# Patient Record
Sex: Male | Born: 1980
Health system: Southern US, Community
[De-identification: ages and names within clinical notes are randomized; demographics above are authoritative.]

## PROBLEM LIST (undated history)

## (undated) DIAGNOSIS — I10 Essential (primary) hypertension: Secondary | ICD-10-CM

## (undated) DIAGNOSIS — F329 Major depressive disorder, single episode, unspecified: Secondary | ICD-10-CM

## (undated) DIAGNOSIS — F32A Depression, unspecified: Secondary | ICD-10-CM

## (undated) DIAGNOSIS — F419 Anxiety disorder, unspecified: Secondary | ICD-10-CM

## (undated) DIAGNOSIS — E785 Hyperlipidemia, unspecified: Secondary | ICD-10-CM

## (undated) HISTORY — DX: Hyperlipidemia, unspecified: E78.5

## (undated) HISTORY — DX: Anxiety disorder, unspecified: F41.9

## (undated) HISTORY — DX: Major depressive disorder, single episode, unspecified: F32.9

## (undated) HISTORY — DX: Depression, unspecified: F32.A

---

## 2010-08-05 ENCOUNTER — Emergency Department: Payer: Self-pay | Admitting: Emergency Medicine

## 2013-02-26 ENCOUNTER — Encounter (HOSPITAL_COMMUNITY): Payer: Self-pay | Admitting: Emergency Medicine

## 2013-02-26 ENCOUNTER — Emergency Department (HOSPITAL_COMMUNITY)
Admission: EM | Admit: 2013-02-26 | Discharge: 2013-02-26 | Disposition: A | Discharge status: Home / Self Care | Payer: Self-pay | Attending: Emergency Medicine | Admitting: Emergency Medicine

## 2013-02-26 DIAGNOSIS — R51 Headache: Secondary | ICD-10-CM | POA: Insufficient documentation

## 2013-02-26 DIAGNOSIS — I10 Essential (primary) hypertension: Secondary | ICD-10-CM | POA: Insufficient documentation

## 2013-02-26 DIAGNOSIS — R519 Headache, unspecified: Secondary | ICD-10-CM

## 2013-02-26 HISTORY — DX: Essential (primary) hypertension: I10

## 2013-02-26 MED ORDER — DIPHENHYDRAMINE HCL 50 MG/ML IJ SOLN
50.0000 mg | Freq: Once | INTRAMUSCULAR | Status: AC
  Administered 2013-02-26: 50 mg via INTRAMUSCULAR
  Filled 2013-02-26: qty 1

## 2013-02-26 MED ORDER — METOCLOPRAMIDE HCL 5 MG/ML IJ SOLN
10.0000 mg | Freq: Once | INTRAMUSCULAR | Status: AC
  Administered 2013-02-26: 10 mg via INTRAMUSCULAR
  Filled 2013-02-26: qty 2

## 2013-02-26 NOTE — ED Provider Notes (Signed)
CSN: [id number]     Arrival date & time 02/26/13  1301 History   This chart was scribed for Joya Gaskins, MD by Bennett Scrape, ED Scribe. This patient was seen in room APA11/APA11 and the patient's care was started at 1:41 PM.   Chief Complaint  Patient presents with  . Headache    Patient is a 32 y.o. male presenting with headaches. The history is provided by the patient. No language interpreter was used.  Headache Location: posterior vertex. Radiates to:  Does not radiate Severity currently:  6/10 Severity at highest:  6/10 Onset quality: rapid. Duration:  2 weeks Timing:  Intermittent Chronicity:  New Context comment:  Uncontrolled HTN Relieved by:  Resting in a darkened room and acetaminophen Associated symptoms: no blurred vision, no fever, no nausea, no visual change and no vomiting     HPI Comments: Bruce Mitchell is a 32 y.o. male who presents to the Emergency Department complaining of an intermittent HA located at the posterior vertex that started rapidly two weeks ago. He states that the HA waxes and wanes during the episodes and has occasionally improved with Aleve. He attributes the pain to his h/o HTN. He states that he has not been taking any HTN medications in over the past two years. His HTN was regulated while incarcerated with hydrochlorothiazide. He is unable to provide any recent BP measurements. BP in the ED is 132/83. He denies any prior h/o CVA or TIA. He denies any recent long distance or out of country travels. He denies any fevers, emesis, visual disturbance and weakness in extremities as associated symptoms.  No PCP currently  Past Medical History  Diagnosis Date  . Hypertension    History reviewed. No pertinent past surgical history. History reviewed. No pertinent family history. History  Substance Use Topics  . Smoking status: Never Smoker   . Smokeless tobacco: Not on file  . Alcohol Use: No    Review of Systems  Constitutional:  Negative for fever.  Eyes: Negative for blurred vision and visual disturbance.  Gastrointestinal: Negative for nausea and vomiting.  Neurological: Positive for headaches.  All other systems reviewed and are negative.    Allergies  Review of patient's allergies indicates no known allergies.  Home Medications  No current outpatient prescriptions on file.  Triage Vitals: BP 132/83  Pulse 69  Temp(Src) 98.3 F (36.8 C) (Oral)  Resp 14  Ht 6' (1.829 m)  Wt 185 lb (83.915 kg)  BMI 25.08 kg/m2  SpO2 99%  Physical Exam  Nursing note and vitals reviewed.  CONSTITUTIONAL: Well developed/well nourished HEAD: Normocephalic/atraumatic EYES: EOMI/PERRL, no nystagmus ENMT: Mucous membranes moist NECK: supple no meningeal signs, no bruits CV: S1/S2 noted, no murmurs/rubs/gallops noted LUNGS: Lungs are clear to auscultation bilaterally, no apparent distress ABDOMEN: soft, nontender, no rebound or guarding NEURO:Awake/alert, facies symmetric, no arm or leg drift is noted Cranial nerves 3/4/5/6/11/15/08/11/12 tested and intact Gait normal without ataxia EXTREMITIES: pulses normal, full ROM SKIN: warm, color normal PSYCH: no abnormalities of mood noted  ED Course  Procedures (including critical care time)  DIAGNOSTIC STUDIES: Oxygen Saturation is 99% on room air, normal by my interpretation.    COORDINATION OF CARE: 1:46 PM-Discussed treatment plan which includes following up for HTN and medications for HA with pt at bedside and pt agreed to plan. Pt has a ride home.    EKG Interpretation   None       Pt well appearing, no distress, low suspicion for [facility]  or other acute neurologic process I encouraged close followup and BP monitoring   MDM  No diagnosis found. Nursing notes including past medical history and social history reviewed and considered in documentation Labs/vital reviewed and considered   I personally performed the services described in this documentation,  which was scribed in my presence. The recorded information has been reviewed and is accurate.    Joya Gaskins, MD 02/26/13 2216

## 2013-02-26 NOTE — ED Notes (Signed)
Patient to ED c/o headache that has been ongoing for "3 weeks" per patient. States that headache is worse with noise. Patient states that he is dizzy when he gets up sometimes. Patient reports that he has been treated with high blood pressure medication in the past but has not had it for approx 6 months. Pain 6/10 described as throbbing and aching. Patient denies nausea, vomiting, and diarrhea. Patient reports to be very thirsty and drinking lots of fluids at home. Patient states that his appetite has been normal. Patient has been taking Advil for pain but reports that it has not helped the past few days, last taken yesterday.

## 2013-02-26 NOTE — ED Notes (Signed)
Has had 2 week history of HA intermittently.  Over last two days has been more severe.  Is suppose to be on BP meds, but does not have any.

## 2013-02-26 NOTE — Progress Notes (Signed)
ED/CM noted patient did not have health insurance and/or PCP listed in the computer.  Patient was given the [place] resource handout with information on the clinics, food pantries, and the handout for new health insurance sign-up.  Patient expressed appreciation for this. 

## 2014-11-27 ENCOUNTER — Encounter: Payer: Self-pay | Admitting: Family Medicine

## 2014-11-27 DIAGNOSIS — F329 Major depressive disorder, single episode, unspecified: Secondary | ICD-10-CM | POA: Insufficient documentation

## 2014-11-27 DIAGNOSIS — F32A Depression, unspecified: Secondary | ICD-10-CM | POA: Insufficient documentation

## 2014-11-27 DIAGNOSIS — I10 Essential (primary) hypertension: Secondary | ICD-10-CM | POA: Insufficient documentation

## 2014-11-27 DIAGNOSIS — F419 Anxiety disorder, unspecified: Secondary | ICD-10-CM | POA: Insufficient documentation

## 2014-11-27 DIAGNOSIS — E785 Hyperlipidemia, unspecified: Secondary | ICD-10-CM | POA: Insufficient documentation

## 2014-12-03 ENCOUNTER — Ambulatory Visit: Payer: 59 | Admitting: Family Medicine

## 2014-12-16 ENCOUNTER — Ambulatory Visit: Payer: Self-pay | Admitting: Family Medicine

## 2015-01-01 ENCOUNTER — Telehealth: Payer: Self-pay | Admitting: Family Medicine

## 2015-01-01 ENCOUNTER — Encounter: Payer: Self-pay | Admitting: Family Medicine

## 2015-01-01 ENCOUNTER — Ambulatory Visit (INDEPENDENT_AMBULATORY_CARE_PROVIDER_SITE_OTHER): Payer: 59 | Admitting: Family Medicine

## 2015-01-01 VITALS — BP 148/88 | HR 82 | Temp 98.9°F | Resp 16 | Ht 69.5 in | Wt 219.0 lb

## 2015-01-01 DIAGNOSIS — F419 Anxiety disorder, unspecified: Secondary | ICD-10-CM

## 2015-01-01 DIAGNOSIS — Z0000 Encounter for general adult medical examination without abnormal findings: Secondary | ICD-10-CM | POA: Diagnosis not present

## 2015-01-01 DIAGNOSIS — F32A Depression, unspecified: Secondary | ICD-10-CM

## 2015-01-01 DIAGNOSIS — F329 Major depressive disorder, single episode, unspecified: Secondary | ICD-10-CM | POA: Diagnosis not present

## 2015-01-01 DIAGNOSIS — E669 Obesity, unspecified: Secondary | ICD-10-CM

## 2015-01-01 DIAGNOSIS — E785 Hyperlipidemia, unspecified: Secondary | ICD-10-CM

## 2015-01-01 DIAGNOSIS — F39 Unspecified mood [affective] disorder: Secondary | ICD-10-CM | POA: Diagnosis not present

## 2015-01-01 DIAGNOSIS — I10 Essential (primary) hypertension: Secondary | ICD-10-CM | POA: Diagnosis not present

## 2015-01-01 MED ORDER — HYDROCHLOROTHIAZIDE 25 MG PO TABS: 25.0000 mg | ORAL_TABLET | Freq: Every day | ORAL | Status: DC

## 2015-01-01 NOTE — Patient Instructions (Signed)
Start HCTZ once a day in the morning for blood pressure Referral to psychiatrist  We will call with lab results  F/U 6 weeks- blood pressure check

## 2015-01-01 NOTE — Assessment & Plan Note (Signed)
Start HCTZ  once a day, recheck in 6 weeks

## 2015-01-01 NOTE — Progress Notes (Signed)
Patient ID: Bruce Mitchell, male   DOB: 23-Jun-1980, 34 y.o.   MRN: [id number]   Subjective:    Patient ID: Bruce Mitchell, male    DOB: [birth year], 34 y.o.   MRN: [id number]  Patient presents for New Patient- Establish Care; HTN; Hyperlipidemia; and Anxiety  patient here for new physical and to establish care. He was last seen when he was incarcerated at that time he was diagnosed with anxiety as well as hypertension and hyperlipidemia he states that he was on some medications while incarcerated but does not remember the exact names of the medications. He gets headaches when his blood pressure is running high at times. He denies any chest pain or shortness of breath. He denies any tobacco use he quit this as well as marijuana. He denies any alcohol abuse.  His other major concern is his mood he states at times he felt depressed other times he has significant anger issues and has outbursts he also gets anxiety. There was concern that he was bipolar but he never followed up with mental health when he was I out of prison. He will like to be referred to a psychiatrist for further evaluation and treatment. He also has difficulty sleeping.   when asked about his bowels he states that if he eats any dairy products and he will have diarrhea otherwise he is not having problems with his bowels.   he does tell me that he is gaining weight over the past couple years his weight was typically around 175 pounds   his family history was reviewed he is not sure what type of cancer that his father passed away from.    Review Of Systems:  GEN- denies fatigue, fever, weight loss,weakness, recent illness HEENT- denies eye drainage, change in vision, nasal discharge, CVS- denies chest pain, palpitations RESP- denies SOB, cough, wheeze ABD- denies N/V, change in stools, abd pain GU- denies dysuria, hematuria, dribbling, incontinence MSK- denies joint pain, muscle aches, injury Neuro- denies headache, dizziness,  syncope, seizure activity       Objective:    BP 148/88 mmHg  Pulse 82  Temp(Src) 98.9 F (37.2 C) (Oral)  Resp 16  Ht 5' 9.5" (1.765 m)  Wt 219 lb (99.338 kg)  BMI 31.89 kg/m2 GEN- NAD, alert and oriented x3,obese HEENT- PERRL, EOMI, non injected sclera, pink conjunctiva, MMM, oropharynx clear Neck- Supple, no thyromegaly CVS- RRR, no murmur RESP-CTAB ABD-NABS,soft,NT,ND Psych- very pleasant, normal affect and mood,  EXT- No edema Pulses- Radial, DP- 2+        Assessment & Plan:      Problem List Items Addressed This Visit    Mood disorder   Hypertension - Primary    Start HCTZ  once a day, recheck in 6 weeks      Relevant Medications   hydrochlorothiazide (HYDRODIURIL) 25 MG tablet   Other Relevant Orders   CBC with Differential/Platelet   Comprehensive metabolic panel   TSH   Hyperlipidemia   Relevant Medications   hydrochlorothiazide (HYDRODIURIL) 25 MG tablet   Other Relevant Orders   Lipid panel   Depression     He has episodic mood changes along with depression and anxiety he is always had troubles with his mood since he was a teenager. I discussed possibly starting a low-dose medication and treat his depression but he wants to hold until he is evaluated by psychiatry.  He shows no evidence of harm to himself or others therefore I will place referral for evaluation before  he starts medication      Anxiety    Other Visit Diagnoses    Routine general medical examination at a health care facility        CPE done, TDAP done in prison, fasting labs obtained, unknown type of cancer but he thinks it was due to his fathers smoking       Note: This dictation was prepared with Dragon dictation along with smaller phrase technology. Any transcriptional errors that result from this process are unintentional.

## 2015-01-01 NOTE — Telephone Encounter (Signed)
noted 

## 2015-01-01 NOTE — Assessment & Plan Note (Signed)
He has episodic mood changes along with depression and anxiety he is always had troubles with his mood since he was a teenager. I discussed possibly starting a low-dose medication and treat his depression but he wants to hold until he is evaluated by psychiatry.  He shows no evidence of harm to himself or others therefore I will place referral for evaluation before he starts medication

## 2015-01-01 NOTE — Telephone Encounter (Signed)
Wife (is a Engineer, civil (consulting)) wants you to know he has HTN, not taking meds.  Possibly diabetic.  When he eats has sudden diarrhea.  Question if Bipolar, was suppose to see [facility] provider but did not.  Strong family history of all.  Wanted to let you know.  Pt new visit with you today and she was unable to come with him.

## 2015-01-01 NOTE — Telephone Encounter (Signed)
This encounter was created in error - please disregard.

## 2015-01-02 LAB — COMPREHENSIVE METABOLIC PANEL
ALT: 18 U/L (ref 9–46)
AST: 15 U/L (ref 10–40)
Albumin: 4.8 g/dL (ref 3.6–5.1)
Alkaline Phosphatase: 43 U/L (ref 40–115)
BILIRUBIN TOTAL: 0.9 mg/dL (ref 0.2–1.2)
BUN: 8 mg/dL (ref 7–25)
CO2: 23 mmol/L (ref 20–31)
CREATININE: 1.06 mg/dL (ref 0.60–1.35)
Calcium: 9.5 mg/dL (ref 8.6–10.3)
Chloride: 104 mmol/L (ref 98–110)
GLUCOSE: 88 mg/dL (ref 70–99)
Potassium: 4.1 mmol/L (ref 3.5–5.3)
SODIUM: 140 mmol/L (ref 135–146)
Total Protein: 7.7 g/dL (ref 6.1–8.1)

## 2015-01-02 LAB — CBC WITH DIFFERENTIAL/PLATELET
Basophils Absolute: 0.1 10*3/uL (ref 0.0–0.1)
Basophils Relative: 1 % (ref 0–1)
EOS PCT: 7 % — AB (ref 0–5)
Eosinophils Absolute: 0.4 10*3/uL (ref 0.0–0.7)
HCT: 39.7 % (ref 39.0–52.0)
Hemoglobin: 12.7 g/dL — ABNORMAL LOW (ref 13.0–17.0)
Lymphocytes Relative: 41 % (ref 12–46)
Lymphs Abs: 2.2 10*3/uL (ref 0.7–4.0)
MCH: 28 pg (ref 26.0–34.0)
MCHC: 32 g/dL (ref 30.0–36.0)
MCV: 87.6 fL (ref 78.0–100.0)
MONO ABS: 0.3 10*3/uL (ref 0.1–1.0)
MPV: 10.6 fL (ref 8.6–12.4)
Monocytes Relative: 5 % (ref 3–12)
NEUTROS ABS: 2.5 10*3/uL (ref 1.7–7.7)
Neutrophils Relative %: 46 % (ref 43–77)
Platelets: 301 10*3/uL (ref 150–400)
RBC: 4.53 MIL/uL (ref 4.22–5.81)
RDW: 13 % (ref 11.5–15.5)
WBC: 5.4 10*3/uL (ref 4.0–10.5)

## 2015-01-02 LAB — LIPID PANEL
Cholesterol: 263 mg/dL — ABNORMAL HIGH (ref 125–200)
HDL: 35 mg/dL — ABNORMAL LOW (ref >=40)
LDL CALC: 186 mg/dL — AB (ref <130)
Total CHOL/HDL Ratio: 7.5 Ratio — ABNORMAL HIGH (ref <=5.0)
Triglycerides: 210 mg/dL — ABNORMAL HIGH (ref <150)
VLDL: 42 mg/dL — ABNORMAL HIGH (ref <30)

## 2015-01-02 LAB — TSH: TSH: 2.228 u[IU]/mL (ref 0.350–4.500)

## 2015-01-07 ENCOUNTER — Other Ambulatory Visit: Payer: Self-pay | Admitting: *Deleted

## 2015-01-07 MED ORDER — ATORVASTATIN CALCIUM 20 MG PO TABS: 20.0000 mg | ORAL_TABLET | Freq: Every day | ORAL | Status: DC

## 2015-02-12 ENCOUNTER — Ambulatory Visit: Payer: 59 | Admitting: Family Medicine

## 2015-04-02 ENCOUNTER — Ambulatory Visit (INDEPENDENT_AMBULATORY_CARE_PROVIDER_SITE_OTHER): Payer: 59 | Admitting: Family Medicine

## 2015-04-02 ENCOUNTER — Encounter: Payer: Self-pay | Admitting: Family Medicine

## 2015-04-02 VITALS — BP 130/78 | HR 82 | Temp 99.1°F | Resp 16 | Ht 69.5 in | Wt 214.0 lb

## 2015-04-02 DIAGNOSIS — R222 Localized swelling, mass and lump, trunk: Secondary | ICD-10-CM | POA: Diagnosis not present

## 2015-04-02 DIAGNOSIS — E785 Hyperlipidemia, unspecified: Secondary | ICD-10-CM

## 2015-04-02 DIAGNOSIS — I10 Essential (primary) hypertension: Secondary | ICD-10-CM | POA: Diagnosis not present

## 2015-04-02 DIAGNOSIS — Z113 Encounter for screening for infections with a predominantly sexual mode of transmission: Secondary | ICD-10-CM

## 2015-04-02 NOTE — Patient Instructions (Addendum)
[facility] [phone]- call for psychiatry appointment Continue current medications SCAN OF CHEST TO BE DONE F/U 6 MONTHS

## 2015-04-02 NOTE — Progress Notes (Signed)
Patient ID: Bruce Mitchell, male   DOB: [birth year], 34 y.o.   MRN: [id number]   Subjective:    Patient ID: Bruce BourbonKenneth Mitchell, male    DOB: [birth year], 34 y.o.   MRN: [id number]  Patient presents for F/U BP  patient to follow-up blood pressure. He's been on hydrochlorothiazide which I started at her last visit. He is also on Lipitor secondary to hyperlipidemia. He is due for recheck on labs. At the end of the visit he states that he has a knot on his right pectoral muscle that is been there and has been painful he thinks it is been growing. He has pain when he moves his arm around. It has been present for greater than a year. He is not Brammer any particular injury. He does have tattoos but they're old.    Review Of Systems:  GEN- denies fatigue, fever, weight loss,weakness, recent illness HEENT- denies eye drainage, change in vision, nasal discharge, CVS- denies chest pain, palpitations RESP- denies SOB, cough, wheeze ABD- denies N/V, change in stools, abd pain GU- denies dysuria, hematuria, dribbling, incontinence MSK- denies joint pain, muscle aches, injury Neuro- denies headache, dizziness, syncope, seizure activity       Objective:    BP 130/78 mmHg  Pulse 82  Temp(Src) 99.1 F (37.3 C) (Oral)  Resp 16  Ht 5' 9.5" (1.765 m)  Wt 214 lb (97.07 kg)  BMI 31.16 kg/m2 GEN- NAD, alert and oriented x3 HEENT- PERRL, EOMI, non injected sclera, pink conjunctiva, MMM, oropharynx clear Chest wall- Right Pec- small quarter size lump palpated TTP no flutance, not visible on skin ?lipoma CVS- RRR, no murmur RESP-CTAB EXT- No edema Pulses- Radial 2+        Assessment & Plan:      Problem List Items Addressed This Visit    Hypertension    Well controlled, no change to HCTZ Given number to psychiatry again, he did not schedule appt after last visit       Relevant Orders   CBC with Differential/Platelet (Completed)   Comprehensive metabolic panel (Completed)   Hyperlipidemia -  Primary    Continue lipitor, check lipids and LFT Discussed diet, weight down some , he has been more active, changing diet       Relevant Orders   Lipid panel (Completed)    Other Visit Diagnoses    Screen for STD (sexually transmitted disease)        Relevant Orders    HIV antibody (Completed)    RPR (Completed)    Chest wall mass         Subtle mass with tenderness over right pec, ?lipoma or musculature change, however per pt growing and painful, r/o soft tissue mass- CT chest     Relevant Orders    CT Chest W Contrast       Note: This dictation was prepared with Dragon dictation along with smaller phrase technology. Any transcriptional errors that result from this process are unintentional.

## 2015-04-03 ENCOUNTER — Encounter: Payer: Self-pay | Admitting: Family Medicine

## 2015-04-03 LAB — CBC WITH DIFFERENTIAL/PLATELET
Basophils Absolute: 0.1 10*3/uL (ref 0.0–0.1)
Basophils Relative: 1 % (ref 0–1)
Eosinophils Absolute: 0.4 10*3/uL (ref 0.0–0.7)
Eosinophils Relative: 6 % — ABNORMAL HIGH (ref 0–5)
HEMATOCRIT: 38 % — AB (ref 39.0–52.0)
Hemoglobin: 11.9 g/dL — ABNORMAL LOW (ref 13.0–17.0)
LYMPHS ABS: 2.4 10*3/uL (ref 0.7–4.0)
LYMPHS PCT: 38 % (ref 12–46)
MCH: 27.7 pg (ref 26.0–34.0)
MCHC: 31.3 g/dL (ref 30.0–36.0)
MCV: 88.6 fL (ref 78.0–100.0)
MONO ABS: 0.6 10*3/uL (ref 0.1–1.0)
MPV: 11.5 fL (ref 8.6–12.4)
Monocytes Relative: 9 % (ref 3–12)
Neutro Abs: 2.9 10*3/uL (ref 1.7–7.7)
Neutrophils Relative %: 46 % (ref 43–77)
Platelets: 370 10*3/uL (ref 150–400)
RBC: 4.29 MIL/uL (ref 4.22–5.81)
RDW: 13.2 % (ref 11.5–15.5)
WBC: 6.3 10*3/uL (ref 4.0–10.5)

## 2015-04-03 LAB — COMPREHENSIVE METABOLIC PANEL
ALBUMIN: 4.3 g/dL (ref 3.6–5.1)
ALT: 32 U/L (ref 9–46)
AST: 25 U/L (ref 10–40)
Alkaline Phosphatase: 45 U/L (ref 40–115)
BILIRUBIN TOTAL: 1.2 mg/dL (ref 0.2–1.2)
BUN: 11 mg/dL (ref 7–25)
CALCIUM: 9.3 mg/dL (ref 8.6–10.3)
CO2: 23 mmol/L (ref 20–31)
Chloride: 106 mmol/L (ref 98–110)
Creat: 1.13 mg/dL (ref 0.60–1.35)
GLUCOSE: 108 mg/dL — AB (ref 70–99)
POTASSIUM: 3.9 mmol/L (ref 3.5–5.3)
Sodium: 139 mmol/L (ref 135–146)
Total Protein: 7.5 g/dL (ref 6.1–8.1)

## 2015-04-03 LAB — LIPID PANEL
CHOLESTEROL: 173 mg/dL (ref 125–200)
HDL: 36 mg/dL — ABNORMAL LOW (ref >=40)
LDL CALC: 84 mg/dL (ref <130)
TRIGLYCERIDES: 263 mg/dL — AB (ref <150)
Total CHOL/HDL Ratio: 4.8 Ratio (ref <=5.0)
VLDL: 53 mg/dL — ABNORMAL HIGH (ref <30)

## 2015-04-03 LAB — HIV ANTIBODY (ROUTINE TESTING W REFLEX): HIV 1&2 Ab, 4th Generation: NONREACTIVE

## 2015-04-03 LAB — RPR

## 2015-04-03 NOTE — Assessment & Plan Note (Signed)
Well controlled, no change to HCTZ Given number to psychiatry again, he did not schedule appt after last visit

## 2015-04-03 NOTE — Assessment & Plan Note (Signed)
Continue lipitor, check lipids and LFT Discussed diet, weight down some , he has been more active, changing diet

## 2015-04-10 ENCOUNTER — Encounter: Payer: Self-pay | Admitting: *Deleted

## 2015-04-21 ENCOUNTER — Other Ambulatory Visit: Payer: Self-pay | Admitting: Family Medicine

## 2015-04-21 DIAGNOSIS — R222 Localized swelling, mass and lump, trunk: Secondary | ICD-10-CM

## 2015-04-24 ENCOUNTER — Other Ambulatory Visit: Payer: Self-pay

## 2015-08-13 ENCOUNTER — Ambulatory Visit (INDEPENDENT_AMBULATORY_CARE_PROVIDER_SITE_OTHER): Payer: BLUE CROSS/BLUE SHIELD | Admitting: *Deleted

## 2015-08-13 DIAGNOSIS — Z111 Encounter for screening for respiratory tuberculosis: Secondary | ICD-10-CM | POA: Diagnosis not present

## 2015-08-15 ENCOUNTER — Ambulatory Visit: Payer: BLUE CROSS/BLUE SHIELD | Admitting: *Deleted

## 2015-08-15 ENCOUNTER — Encounter: Payer: Self-pay | Admitting: General Practice

## 2015-08-15 DIAGNOSIS — Z111 Encounter for screening for respiratory tuberculosis: Secondary | ICD-10-CM

## 2015-08-15 LAB — TB SKIN TEST
Induration: 0 mm
TB Skin Test: NEGATIVE

## 2015-09-30 ENCOUNTER — Ambulatory Visit: Payer: 59 | Admitting: Family Medicine

## 2015-10-01 ENCOUNTER — Encounter: Payer: Self-pay | Admitting: General Practice

## 2015-10-08 ENCOUNTER — Encounter: Payer: Self-pay | Admitting: General Practice

## 2016-02-20 ENCOUNTER — Encounter: Payer: Self-pay | Admitting: General Practice

## 2016-04-16 ENCOUNTER — Ambulatory Visit (INDEPENDENT_AMBULATORY_CARE_PROVIDER_SITE_OTHER): Payer: BLUE CROSS/BLUE SHIELD | Admitting: Family Medicine

## 2016-04-16 ENCOUNTER — Encounter: Payer: Self-pay | Admitting: Family Medicine

## 2016-04-16 VITALS — BP 172/90 | HR 78 | Temp 98.9°F | Resp 14 | Ht 69.5 in | Wt 211.0 lb

## 2016-04-16 DIAGNOSIS — E782 Mixed hyperlipidemia: Secondary | ICD-10-CM | POA: Diagnosis not present

## 2016-04-16 DIAGNOSIS — R519 Headache, unspecified: Secondary | ICD-10-CM

## 2016-04-16 DIAGNOSIS — I10 Essential (primary) hypertension: Secondary | ICD-10-CM | POA: Diagnosis not present

## 2016-04-16 DIAGNOSIS — F39 Unspecified mood [affective] disorder: Secondary | ICD-10-CM

## 2016-04-16 DIAGNOSIS — R51 Headache: Secondary | ICD-10-CM | POA: Diagnosis not present

## 2016-04-16 DIAGNOSIS — G8929 Other chronic pain: Secondary | ICD-10-CM

## 2016-04-16 LAB — CBC WITH DIFFERENTIAL/PLATELET
BASOS ABS: 60 {cells}/uL (ref 0–200)
Basophils Relative: 1 %
Eosinophils Absolute: 420 cells/uL (ref 15–500)
Eosinophils Relative: 7 %
HEMATOCRIT: 40.1 % (ref 38.5–50.0)
HEMOGLOBIN: 12.9 g/dL — AB (ref 13.0–17.0)
LYMPHS ABS: 2460 {cells}/uL (ref 850–3900)
LYMPHS PCT: 41 %
MCH: 28.4 pg (ref 27.0–33.0)
MCHC: 32.2 g/dL (ref 32.0–36.0)
MCV: 88.1 fL (ref 80.0–100.0)
MONO ABS: 420 {cells}/uL (ref 200–950)
MPV: 10.7 fL (ref 7.5–12.5)
Monocytes Relative: 7 %
Neutro Abs: 2640 cells/uL (ref 1500–7800)
Neutrophils Relative %: 44 %
Platelets: 299 10*3/uL (ref 140–400)
RBC: 4.55 MIL/uL (ref 4.20–5.80)
RDW: 12.7 % (ref 11.0–15.0)
WBC: 6 10*3/uL (ref 3.8–10.8)

## 2016-04-16 LAB — COMPREHENSIVE METABOLIC PANEL
ALBUMIN: 4.7 g/dL (ref 3.6–5.1)
ALT: 21 U/L (ref 9–46)
AST: 13 U/L (ref 10–40)
Alkaline Phosphatase: 45 U/L (ref 40–115)
BUN: 12 mg/dL (ref 7–25)
CHLORIDE: 105 mmol/L (ref 98–110)
CO2: 25 mmol/L (ref 20–31)
Calcium: 9.6 mg/dL (ref 8.6–10.3)
Creat: 1.21 mg/dL (ref 0.60–1.35)
GLUCOSE: 111 mg/dL — AB (ref 70–99)
POTASSIUM: 4.1 mmol/L (ref 3.5–5.3)
Sodium: 141 mmol/L (ref 135–146)
Total Bilirubin: 0.9 mg/dL (ref 0.2–1.2)
Total Protein: 7.9 g/dL (ref 6.1–8.1)

## 2016-04-16 LAB — LIPID PANEL
CHOL/HDL RATIO: 7.1 ratio — AB (ref <5.0)
Cholesterol: 248 mg/dL — ABNORMAL HIGH (ref <200)
HDL: 35 mg/dL — AB (ref >40)
LDL CALC: 178 mg/dL — AB (ref <100)
TRIGLYCERIDES: 177 mg/dL — AB (ref <150)
VLDL: 35 mg/dL — ABNORMAL HIGH (ref <30)

## 2016-04-16 LAB — TSH: TSH: 1.89 mIU/L (ref 0.40–4.50)

## 2016-04-16 MED ORDER — SUMATRIPTAN SUCCINATE 50 MG PO TABS: ORAL_TABLET | ORAL | 0 refills | Status: AC

## 2016-04-16 MED ORDER — LISINOPRIL-HYDROCHLOROTHIAZIDE 10-12.5 MG PO TABS: 1.0000 | ORAL_TABLET | Freq: Every day | ORAL | 1 refills | Status: DC

## 2016-04-16 NOTE — Progress Notes (Signed)
   Subjective:    Patient ID: Bruce Mitchell, male    DOB: 1981-01-19, 35 y.o.   MRN: [id number]  Patient presents for High BP (states that he has been getting bad HA with high BP)  Pt here with headaches, feel like BP has been high, he only took the HCTZ for 1 month after our last visit then never refilled states "he didn't think it was working", despite his blood pressure being improved, this was in Nov 2016. He has had occasional chest pain, midsternal does not radiate no SOB, no N/V. Denies heartburn symptoms.   Headache, no N/V no change in vision, taking tylenol and motrin  Which are not helping  He was also on statin drug, did not take this either.  Note he was also to re-stablish with pyschiatry for his anxiet/depression, he states he did not have time.  Review Of Systems:  GEN- denies fatigue, fever, weight loss,weakness, recent illness HEENT- denies eye drainage, change in vision, nasal discharge, CVS- denies chest pain, palpitations RESP- denies SOB, cough, wheeze ABD- denies N/V, change in stools, abd pain GU- denies dysuria, hematuria, dribbling, incontinence MSK- denies joint pain, muscle aches, injury Neuro- + headache, dizziness, syncope, seizure activity       Objective:    BP (!) 172/90   Pulse 78   Temp 98.9 F (37.2 C) (Oral)   Resp 14   Ht 5' 9.5" (1.765 m)   Wt 211 lb (95.7 kg)   SpO2 99%   BMI 30.71 kg/m  GEN- NAD, alert and oriented x3 HEENT- PERRL, EOMI, non injected sclera, pink conjunctiva, MMM, oropharynx clear Neck- Supple, no thyromegaly CVS- RRR, no murmur RESP-CTAB Psych- normal affect and mood Neuro- CNII-XII intact, no focal deficit  EXT- No edema Pulses- Radial  2+  EKG- NSR, no ST changes        Assessment & Plan:      Problem List Items Addressed This Visit    Mood disorder (HCC)    For his mood disorder, no new concerns today  Given the number again for his psychiatrist to schedule       Hypertension - Primary   Uncontrolled, start lisinopril HCTZ Check fasting las EKG reassuring no LVH seen Check cholesterol as well, discussed healthier eating  Headaches will improve as BP comes down, to decrease NSAID overuse associated with headache, given imitrex prn       Relevant Medications   lisinopril-hydrochlorothiazide (PRINZIDE,ZESTORETIC) 10-12.5 MG tablet   Other Relevant Orders   EKG 12-Lead (Completed)   CBC with Differential/Platelet (Completed)   Comprehensive metabolic panel (Completed)   Lipid panel (Completed)   TSH (Completed)   Hyperlipidemia   Relevant Medications   lisinopril-hydrochlorothiazide (PRINZIDE,ZESTORETIC) 10-12.5 MG tablet    Other Visit Diagnoses    Chronic nonintractable headache, unspecified headache type       Relevant Medications   SUMAtriptan (IMITREX) 50 MG tablet      Note: This dictation was prepared with Dragon dictation along with smaller phrase technology. Any transcriptional errors that result from this process are unintentional.

## 2016-04-16 NOTE — Patient Instructions (Addendum)
Take the blood pressure medication once a day  Use the imitrexc  We will call with lab results  F/U1 month for recheck Call [facility] [phone]- call for psychiatry appointment

## 2016-04-18 NOTE — Assessment & Plan Note (Signed)
For his mood disorder, no new concerns today  Given the number again for his psychiatrist to schedule

## 2016-04-18 NOTE — Assessment & Plan Note (Addendum)
Uncontrolled, start lisinopril HCTZ Check fasting las EKG reassuring no LVH seen Check cholesterol as well, discussed healthier eating  Headaches will improve as BP comes down, to decrease NSAID overuse associated with headache, given imitrex prn

## 2016-04-20 ENCOUNTER — Other Ambulatory Visit: Payer: Self-pay | Admitting: *Deleted

## 2016-04-20 MED ORDER — ATORVASTATIN CALCIUM 20 MG PO TABS: 20.0000 mg | ORAL_TABLET | Freq: Every day | ORAL | 3 refills | Status: AC

## 2016-08-10 ENCOUNTER — Encounter: Payer: Self-pay | Admitting: General Practice

## 2016-09-07 ENCOUNTER — Encounter: Payer: Self-pay | Admitting: General Practice

## 2016-11-11 ENCOUNTER — Other Ambulatory Visit: Payer: Self-pay | Admitting: Family Medicine

## 2016-11-24 ENCOUNTER — Encounter: Payer: Self-pay | Admitting: General Practice

## 2017-02-03 ENCOUNTER — Other Ambulatory Visit: Payer: Self-pay | Admitting: Family Medicine

## 2017-02-10 ENCOUNTER — Telehealth: Payer: Self-pay | Admitting: General Practice

## 2017-02-10 NOTE — Telephone Encounter (Signed)
pts wife called in on 02/08/2017, wanting to schedule an appointment. I notified her that he has been "discharged" until we receive payment in full of $353.00 $20.00 of which was for a copayment amount and $333.00 of which was denied by [employer]. Per shannon when a patient is discharged we are not allowed to remove the hold on the account until it is paid in full by both parties (patient and insurance company). Patient and patient wife are both upset because he is out of his bp medication. I notified her of the issues with the [employer] and they have contacted them and Carollee Herter will have to resubmit the claims to see if they will pay, there is not a guarantee that they will due to timely filing because the claim was from 2017, all we can do is resubmit and hope they pay, if not it will still be pts responsibility. pts wife wants a call back from nurse reiterating what I have already told her. I did let them know that if he is out of his medication that he could go to the Urgent care and they can do a refill until we can get the payments worked out with [employer] and they were not happy with that option.

## 2017-02-10 NOTE — Telephone Encounter (Signed)
He never followed up, or answered letters Can go to UC due to dismissal

## 2017-04-11 ENCOUNTER — Other Ambulatory Visit: Payer: Self-pay | Admitting: Family Medicine

## 2017-06-02 ENCOUNTER — Other Ambulatory Visit: Payer: Self-pay

## 2017-06-02 ENCOUNTER — Emergency Department (HOSPITAL_COMMUNITY)
Admission: EM | Admit: 2017-06-02 | Discharge: 2017-06-02 | Disposition: A | Discharge status: Home / Self Care | Payer: BLUE CROSS/BLUE SHIELD | Attending: Emergency Medicine | Admitting: Emergency Medicine

## 2017-06-02 ENCOUNTER — Encounter (HOSPITAL_COMMUNITY): Payer: Self-pay | Admitting: Emergency Medicine

## 2017-06-02 DIAGNOSIS — Z79899 Other long term (current) drug therapy: Secondary | ICD-10-CM | POA: Diagnosis not present

## 2017-06-02 DIAGNOSIS — I10 Essential (primary) hypertension: Secondary | ICD-10-CM | POA: Insufficient documentation

## 2017-06-02 DIAGNOSIS — Z87891 Personal history of nicotine dependence: Secondary | ICD-10-CM | POA: Diagnosis not present

## 2017-06-02 DIAGNOSIS — K0889 Other specified disorders of teeth and supporting structures: Secondary | ICD-10-CM

## 2017-06-02 MED ORDER — IBUPROFEN 800 MG PO TABS: 800.0000 mg | ORAL_TABLET | Freq: Three times a day (TID) | ORAL | 0 refills | Status: DC

## 2017-06-02 MED ORDER — PENICILLIN V POTASSIUM 500 MG PO TABS: 500.0000 mg | ORAL_TABLET | Freq: Four times a day (QID) | ORAL | 0 refills | Status: AC

## 2017-06-02 NOTE — ED Provider Notes (Signed)
[facility] EMERGENCY DEPARTMENT Provider Note   CSN: [id number] Arrival date & time: 06/02/17  1400     History   Chief Complaint Chief Complaint  Patient presents with  . Dental Pain    HPI Bruce Mitchell is a 37 y.o. male.  HPI  Bruce Mitchell is a 37 y.o. male who presents to the Emergency Department complaining of right lower dental pain for 4 days.  Patient complains of pain and swelling to the right lower gums.  Pain is worse with chewing.  He also states that today he noticed similar pain to the right upper gums as well.  He states that his wisdom teeth have been "trying to come in" for some time.  He has made an appointment with a dentist for next week but the pain became more severe today.  He denies facial swelling, fever, neck pain, difficulty swallowing or breathing.   Past Medical History:  Diagnosis Date  . Anxiety   . Depression   . Hyperlipidemia   . Hypertension     Patient Active Problem List   Diagnosis Date Noted  . Mood disorder (HCC) 01/01/2015  . Obesity 01/01/2015  . Anxiety   . Depression   . Hypertension   . Hyperlipidemia     History reviewed. No pertinent surgical history.     Home Medications    Prior to Admission medications   Medication Sig Start Date End Date Taking? Authorizing Provider  atorvastatin (LIPITOR) 20 MG tablet Take 1 tablet (20 mg total) by mouth daily. 04/20/16   Salley Scarleturham, Kawanta F, MD  lisinopril-hydrochlorothiazide (PRINZIDE,ZESTORETIC) 10-12.5 MG tablet TAKE 1 TABLET BY MOUTH DAILY 02/03/17   [place], Velna HatchetKawanta F, MD  Omega-3 Fatty Acids (FISH OIL) 1000 MG CAPS Take by mouth 2 (two) times daily.    [provider]  SUMAtriptan (IMITREX) 50 MG tablet Take 1 at onset may repeat in 2 hours, max 2 pills a day 04/16/16   Salley Scarleturham, Kawanta F, MD    Family History Family History  Problem Relation Age of Onset  . Alcohol abuse Mother   . Depression Mother   . Diabetes Mother   . Drug abuse Mother   .  Hypertension Mother   . Mental illness Mother   . Learning disabilities Brother   . Mental illness Brother   . Cancer Father   . Mental illness Sister   . Mental illness Sister     Social History Social History   Tobacco Use  . Smoking status: Former Smoker    Types: Cigarettes  . Smokeless tobacco: Never Used  Substance Use Topics  . Alcohol use: Yes    Alcohol/week: 0.6 oz    Types: 1 Shots of liquor per week    Comment: occ  . Drug use: No     Allergies   Patient has no known allergies.   Review of Systems Review of Systems  Constitutional: Negative for appetite change and fever.  HENT: Positive for dental problem. Negative for congestion, facial swelling, sore throat and trouble swallowing.   Eyes: Negative for pain and visual disturbance.  Musculoskeletal: Negative for neck pain and neck stiffness.  Neurological: Negative for dizziness, facial asymmetry and headaches.  Hematological: Negative for adenopathy.  All other systems reviewed and are negative.    Physical Exam Updated Vital Signs BP 115/73 (BP Location: Right Arm)   Pulse 69   Temp 98.8 F (37.1 C) (Oral)   Resp 16   Ht 6\' 2"  (1.88 m)  Wt 95.3 kg (210 lb)   SpO2 100%   BMI 26.96 kg/m   Physical Exam  Constitutional: He is oriented to person, place, and time. He appears well-developed and well-nourished. No distress.  HENT:  Head: Normocephalic and atraumatic.  Right Ear: Tympanic membrane and ear canal normal.  Left Ear: Tympanic membrane and ear canal normal.  Mouth/Throat: Uvula is midline, oropharynx is clear and moist and mucous membranes are normal. No trismus in the jaw. Dental caries present. No dental abscesses or uvula swelling.  ttp and partially impacted right upper and lower third molars  No facial swelling, obvious dental abscess, trismus, or sublingual abnml.    Neck: Normal range of motion. Neck supple.  Cardiovascular: Normal rate, regular rhythm and intact distal pulses.    No murmur heard. Pulmonary/Chest: Effort normal and breath sounds normal.  Musculoskeletal: Normal range of motion.  Lymphadenopathy:    He has no cervical adenopathy.  Neurological: He is alert and oriented to person, place, and time. He exhibits normal muscle tone. Coordination normal.  Skin: Skin is warm and dry. Capillary refill takes less than 2 seconds.  Nursing note and vitals reviewed.    ED Treatments / Results  Labs (all labs ordered are listed, but only abnormal results are displayed) Labs Reviewed - No data to display  EKG  EKG Interpretation None       Radiology No results found.  Procedures Procedures (including critical care time)  Medications Ordered in ED Medications - No data to display   Initial Impression / Assessment and Plan / ED Course  I have reviewed the triage vital signs and the nursing notes.  Pertinent labs & imaging results that were available during my care of the patient were reviewed by me and considered in my medical decision making (see chart for details).     Patient with impacted third molars.  No obvious dental abscess. Airway patent.  No concerning symptoms for Ludwig's angina.  Patient has dental appointment next week.  He agrees to treatment plan with ibuprofen and Pen VK  Final Clinical Impressions(s) / ED Diagnoses   Final diagnoses:  Pain, dental    ED Discharge Orders    None       Pauline Aus, PA-C 06/02/17 1606    Long, Arlyss Repress, MD 06/03/17 [phone]

## 2017-06-02 NOTE — Discharge Instructions (Signed)
Warm salt water rinses after meals.  Follow-up with your dentist next week

## 2017-06-02 NOTE — ED Triage Notes (Signed)
PT c/o pain/swelling to right lower rear tooth x4 days.

## 2018-01-27 ENCOUNTER — Emergency Department (HOSPITAL_COMMUNITY)
Admission: EM | Admit: 2018-01-27 | Discharge: 2018-01-28 | Disposition: A | Discharge status: Home / Self Care | Payer: BLUE CROSS/BLUE SHIELD | Attending: Emergency Medicine | Admitting: Emergency Medicine

## 2018-01-27 ENCOUNTER — Emergency Department (HOSPITAL_COMMUNITY): Payer: BLUE CROSS/BLUE SHIELD

## 2018-01-27 ENCOUNTER — Encounter (HOSPITAL_COMMUNITY): Payer: Self-pay

## 2018-01-27 ENCOUNTER — Other Ambulatory Visit: Payer: Self-pay

## 2018-01-27 DIAGNOSIS — Z79899 Other long term (current) drug therapy: Secondary | ICD-10-CM | POA: Diagnosis not present

## 2018-01-27 DIAGNOSIS — Z87891 Personal history of nicotine dependence: Secondary | ICD-10-CM | POA: Insufficient documentation

## 2018-01-27 DIAGNOSIS — Y999 Unspecified external cause status: Secondary | ICD-10-CM | POA: Insufficient documentation

## 2018-01-27 DIAGNOSIS — S93402A Sprain of unspecified ligament of left ankle, initial encounter: Secondary | ICD-10-CM | POA: Diagnosis not present

## 2018-01-27 DIAGNOSIS — X500XXA Overexertion from strenuous movement or load, initial encounter: Secondary | ICD-10-CM | POA: Insufficient documentation

## 2018-01-27 DIAGNOSIS — T1490XA Injury, unspecified, initial encounter: Secondary | ICD-10-CM

## 2018-01-27 DIAGNOSIS — Y929 Unspecified place or not applicable: Secondary | ICD-10-CM | POA: Insufficient documentation

## 2018-01-27 DIAGNOSIS — Y9389 Activity, other specified: Secondary | ICD-10-CM | POA: Insufficient documentation

## 2018-01-27 DIAGNOSIS — I10 Essential (primary) hypertension: Secondary | ICD-10-CM | POA: Diagnosis not present

## 2018-01-27 DIAGNOSIS — S99912A Unspecified injury of left ankle, initial encounter: Secondary | ICD-10-CM | POA: Diagnosis present

## 2018-01-27 NOTE — ED Triage Notes (Signed)
Pt was at work and was coming down ladder and twist ankle 0830hrs

## 2018-01-28 MED ORDER — IBUPROFEN 800 MG PO TABS
800.0000 mg | ORAL_TABLET | Freq: Once | ORAL | Status: AC
  Administered 2018-01-28: 800 mg via ORAL
  Filled 2018-01-28: qty 1

## 2018-01-28 MED ORDER — TRAMADOL HCL 50 MG PO TABS: 50.0000 mg | ORAL_TABLET | Freq: Four times a day (QID) | ORAL | 0 refills | Status: AC | PRN

## 2018-01-28 MED ORDER — TRAMADOL HCL 50 MG PO TABS
50.0000 mg | ORAL_TABLET | Freq: Once | ORAL | Status: AC
Start: 2018-01-28 — End: 2018-01-28
  Administered 2018-01-28: 50 mg via ORAL
  Filled 2018-01-28: qty 1

## 2018-01-28 MED ORDER — NAPROXEN 500 MG PO TABS: 500.0000 mg | ORAL_TABLET | Freq: Two times a day (BID) | ORAL | 0 refills | Status: AC

## 2018-01-28 NOTE — Discharge Instructions (Signed)
Apply ice for 30 minutes 3-4 times a day.  Keep your foot elevated as much as possible.  Use crutches as needed.  Wear ankle splint orthotic as needed.  Take naproxen twice a day.  You may take acetaminophen as needed to get additional pain relief.  Reserve tramadol for pain not relieved by naproxen plus acetaminophen.

## 2018-01-28 NOTE — ED Provider Notes (Signed)
[facility]ERGENCY DEPARTMENT Provider Note   CSN: [id number] Arrival date & time: 01/27/18  2136     History   Chief Complaint Chief Complaint  Patient presents with  . Ankle Pain    Lt    HPI Bruce Mitchell is a 37 y.o. male.  The history is provided by the patient.  Ankle Pain    He injured his left ankle when he twisted it while going down a ladder.  He twisted it with an inversion injury.  He has been unable to bear weight since then.  He rates pain at 10/10.  He denies other injury.  Past Medical History:  Diagnosis Date  . Anxiety   . Depression   . Hyperlipidemia   . Hypertension     Patient Active Problem List   Diagnosis Date Noted  . Mood disorder (HCC) 01/01/2015  . Obesity 01/01/2015  . Anxiety   . Depression   . Hypertension   . Hyperlipidemia     History reviewed. No pertinent surgical history.      Home Medications    Prior to Admission medications   Medication Sig Start Date End Date Taking? Authorizing Provider  atorvastatin (LIPITOR) 20 MG tablet Take 1 tablet (20 mg total) by mouth daily. 04/20/16   Salley Scarleturham, Kawanta F, MD  lisinopril-hydrochlorothiazide (PRINZIDE,ZESTORETIC) 10-12.5 MG tablet TAKE 1 TABLET BY MOUTH DAILY 02/03/17   Salley Scarleturham, Kawanta F, MD  naproxen (NAPROSYN) 500 MG tablet Take 1 tablet (500 mg total) by mouth 2 (two) times daily. 01/28/18   Dione BoozeGlick, Charon Akamine, MD  Omega-3 Fatty Acids (FISH OIL) 1000 MG CAPS Take by mouth 2 (two) times daily.    [provider]  SUMAtriptan (IMITREX) 50 MG tablet Take 1 at onset may repeat in 2 hours, max 2 pills a day 04/16/16   Salley Scarleturham, Kawanta F, MD  traMADol (ULTRAM) 50 MG tablet Take 1 tablet (50 mg total) by mouth every 6 (six) hours as needed. 01/28/18   Dione BoozeGlick, Dinna Severs, MD    Family History Family History  Problem Relation Age of Onset  . Alcohol abuse Mother   . Depression Mother   . Diabetes Mother   . Drug abuse Mother   . Hypertension Mother   . Mental illness Mother     . Learning disabilities Brother   . Mental illness Brother   . Cancer Father   . Mental illness Sister   . Mental illness Sister     Social History Social History   Tobacco Use  . Smoking status: Former Smoker    Types: Cigarettes  . Smokeless tobacco: Never Used  Substance Use Topics  . Alcohol use: Yes    Alcohol/week: 1.0 standard drinks    Types: 1 Shots of liquor per week    Comment: occ  . Drug use: No     Allergies   Patient has no known allergies.   Review of Systems Review of Systems  All other systems reviewed and are negative.    Physical Exam Updated Vital Signs BP (!) 144/86 (BP Location: Right Arm)   Pulse 85   Temp 97.9 F (36.6 C) (Oral)   Resp 18   Ht 5\' 8"  (1.727 m)   Wt 95.3 kg   SpO2 100%   BMI 31.93 kg/m   Physical Exam  Nursing note and vitals reviewed.  37 year old male, resting comfortably and in no acute distress. Vital signs are significant for mildly elevated systolic blood pressure. Oxygen saturation is 100%,  which is normal. Head is normocephalic and atraumatic. PERRLA, EOMI. Oropharynx is clear. Neck is nontender and supple without adenopathy or JVD. Back is nontender and there is no CVA tenderness. Lungs are clear without rales, wheezes, or rhonchi. Chest is nontender. Heart has regular rate and rhythm without murmur. Abdomen is soft, flat, nontender without masses or hepatosplenomegaly and peristalsis is normoactive. Extremities: Mild to moderate swelling over both malleoli of the left ankle and also over the dorsum of the left foot.  There is moderate tenderness to palpation over the base of the fifth metatarsal, fibulotalar ligament, and tibiotalar ligament.  There is mild instability of the left ankle, but this is symmetric with the other side.  Anterior drawer sign is negative.  There is no tenderness to palpation over the head of the fibula. Skin is warm and dry without rash. Neurologic: Mental status is normal, cranial  nerves are intact, there are no motor or sensory deficits.  ED Treatments / Results   Radiology Dg Ankle Complete Left  Result Date: 01/27/2018 CLINICAL DATA:  Left ankle inversion with pain. EXAM: LEFT ANKLE COMPLETE - 3+ VIEW COMPARISON:  None. FINDINGS: There is no evidence of acute fracture, dislocation, or joint effusion. There is no evidence of arthropathy or other focal bone abnormality. Soft tissue swelling about the malleoli more so laterally. No definite joint effusion. IMPRESSION: Soft tissue swelling about the malleoli. Intact base of fifth metatarsal. No joint dislocation or fracture. Electronically Signed   By: Tollie Eth M.D.   On: 01/27/2018 22:44    Procedures Procedures   Medications Ordered in ED Medications  traMADol (ULTRAM) tablet 50 mg (has no administration in time range)  ibuprofen (ADVIL,MOTRIN) tablet 800 mg (has no administration in time range)     Initial Impression / Assessment and Plan / ED Course  I have reviewed the triage vital signs and the nursing notes.  Pertinent imaging results that were available during my care of the patient were reviewed by me and considered in my medical decision making (see chart for details).  Left ankle sprain.  X-rays are negative for fracture.  He is given crutches and placed in an ankle splint orthotic-told to use both as needed.  Given prescription for naproxen and tramadol.  Follow-up with orthopedics.  Final Clinical Impressions(s) / ED Diagnoses   Final diagnoses:  Sprain of left ankle, unspecified ligament, initial encounter    ED Discharge Orders         Ordered    naproxen (NAPROSYN) 500 MG tablet  2 times daily     01/28/18 0025    traMADol (ULTRAM) 50 MG tablet  Every 6 hours PRN     01/28/18 Nelma Rothman, MD 01/28/18 0030

## 2019-02-23 ENCOUNTER — Other Ambulatory Visit: Payer: Self-pay

## 2019-02-23 DIAGNOSIS — Z20822 Contact with and (suspected) exposure to covid-19: Secondary | ICD-10-CM

## 2019-02-24 LAB — NOVEL CORONAVIRUS, NAA: SARS-CoV-2, NAA: DETECTED — AB

## 2019-03-15 ENCOUNTER — Other Ambulatory Visit: Payer: Self-pay | Admitting: *Deleted

## 2019-03-15 DIAGNOSIS — Z20822 Contact with and (suspected) exposure to covid-19: Secondary | ICD-10-CM

## 2019-03-17 LAB — NOVEL CORONAVIRUS, NAA: SARS-CoV-2, NAA: NOT DETECTED

## 2019-07-17 ENCOUNTER — Emergency Department (HOSPITAL_COMMUNITY): Payer: Self-pay

## 2019-07-17 ENCOUNTER — Other Ambulatory Visit: Payer: Self-pay

## 2019-07-17 ENCOUNTER — Encounter (HOSPITAL_COMMUNITY): Payer: Self-pay | Admitting: Emergency Medicine

## 2019-07-17 ENCOUNTER — Emergency Department (HOSPITAL_COMMUNITY)
Admission: EM | Admit: 2019-07-17 | Discharge: 2019-07-17 | Disposition: A | Discharge status: Home / Self Care | Payer: Self-pay | Attending: Emergency Medicine | Admitting: Emergency Medicine

## 2019-07-17 DIAGNOSIS — M545 Low back pain, unspecified: Secondary | ICD-10-CM

## 2019-07-17 DIAGNOSIS — Z87891 Personal history of nicotine dependence: Secondary | ICD-10-CM | POA: Insufficient documentation

## 2019-07-17 DIAGNOSIS — I10 Essential (primary) hypertension: Secondary | ICD-10-CM | POA: Insufficient documentation

## 2019-07-17 DIAGNOSIS — Z79899 Other long term (current) drug therapy: Secondary | ICD-10-CM | POA: Insufficient documentation

## 2019-07-17 LAB — URINALYSIS, ROUTINE W REFLEX MICROSCOPIC
Bilirubin Urine: NEGATIVE
Glucose, UA: NEGATIVE mg/dL
Hgb urine dipstick: NEGATIVE
Ketones, ur: NEGATIVE mg/dL
Leukocytes,Ua: NEGATIVE
Nitrite: NEGATIVE
Protein, ur: NEGATIVE mg/dL
Specific Gravity, Urine: 1.015 (ref 1.005–1.030)
pH: 5 (ref 5.0–8.0)

## 2019-07-17 NOTE — Discharge Instructions (Addendum)
Your x-ray today is normal.  Your urine test is also normal, no evidence of urinary tract infection or kidney stone seen today. Recommend taking Motrin and Tylenol as needed as directed for your back pain and follow-up with your PCP if pain persists.  Return to ER for fevers, vomiting, worsening pain or urinary problems.

## 2019-07-17 NOTE — ED Triage Notes (Signed)
Pt C/O left sided back pain that started 2 days ago. Pt denies dysuria, hematuria, or trouble urinating.

## 2019-07-17 NOTE — ED Provider Notes (Signed)
[facility] EMERGENCY DEPARTMENT Provider Note   CSN: [id number] Arrival date & time: 07/17/19  1912     History Chief Complaint  Patient presents with  . Back Pain    Bruce Mitchell is a 39 y.o. male.  39 year old male with complaint of left mid back pain onset 2 days ago.  Patient states that he went to his doctor a week ago and his doctor noted that his cholesterol was lower and aspirin he had been doing, patient reported he been drinking a lot of green tea and his doctor informed him this could cause kidney stones.  Patient is concerned that his back pain may be a kidney stone.  No history of prior kidney stones.  Pain is constant for the past 2 days, states no matter how he lays in bed he cannot get comfortable.  Denies any associated nausea, vomiting, changes in bowel or bladder habits.  Pain is worse with movement and bending, no history of falls or injuries.  No other complaints or concerns.        Past Medical History:  Diagnosis Date  . Anxiety   . Depression   . Hyperlipidemia   . Hypertension     Patient Active Problem List   Diagnosis Date Noted  . Mood disorder (HCC) 01/01/2015  . Obesity 01/01/2015  . Anxiety   . Depression   . Hypertension   . Hyperlipidemia     History reviewed. No pertinent surgical history.     Family History  Problem Relation Age of Onset  . Alcohol abuse Mother   . Depression Mother   . Diabetes Mother   . Drug abuse Mother   . Hypertension Mother   . Mental illness Mother   . Learning disabilities Brother   . Mental illness Brother   . Cancer Father   . Mental illness Sister   . Mental illness Sister     Social History   Tobacco Use  . Smoking status: Former Smoker    Types: Cigarettes  . Smokeless tobacco: Never Used  Substance Use Topics  . Alcohol use: Yes    Alcohol/week: 1.0 standard drinks    Types: 1 Shots of liquor per week    Comment: occ  . Drug use: No    Home Medications Prior to Admission  medications   Medication Sig Start Date End Date Taking? Authorizing Provider  atorvastatin (LIPITOR) 20 MG tablet Take 1 tablet (20 mg total) by mouth daily. 04/20/16   Salley Scarlet, MD  lisinopril-hydrochlorothiazide (PRINZIDE,ZESTORETIC) 10-12.5 MG tablet TAKE 1 TABLET BY MOUTH DAILY 02/03/17   Salley Scarlet, MD  naproxen (NAPROSYN) 500 MG tablet Take 1 tablet (500 mg total) by mouth 2 (two) times daily. 01/28/18   Dione Booze, MD  Omega-3 Fatty Acids (FISH OIL) 1000 MG CAPS Take by mouth 2 (two) times daily.    [provider]  SUMAtriptan (IMITREX) 50 MG tablet Take 1 at onset may repeat in 2 hours, max 2 pills a day 04/16/16   Salley Scarlet, MD  traMADol (ULTRAM) 50 MG tablet Take 1 tablet (50 mg total) by mouth every 6 (six) hours as needed. 01/28/18   Dione Booze, MD    Allergies    Patient has no known allergies.  Review of Systems   Review of Systems  Constitutional: Negative for fever.  Gastrointestinal: Negative for abdominal pain, constipation, diarrhea, nausea and vomiting.  Genitourinary: Negative for decreased urine volume, difficulty urinating, dysuria, flank pain, frequency, hematuria  and urgency.  Musculoskeletal: Positive for back pain.  Skin: Negative for rash and wound.  Allergic/Immunologic: Negative for immunocompromised state.  Neurological: Negative for weakness.  Hematological: Negative for adenopathy.  Psychiatric/Behavioral: Negative for confusion.  All other systems reviewed and are negative.   Physical Exam Updated Vital Signs BP 139/71   Pulse 71   Temp 98.4 F (36.9 C) (Oral)   Resp 16   Ht 6' (1.829 m)   Wt 93 kg   SpO2 100%   BMI 27.80 kg/m   Physical Exam Vitals and nursing note reviewed.  Constitutional:      General: He is not in acute distress.    Appearance: He is well-developed. He is not diaphoretic.  HENT:     Head: Normocephalic and atraumatic.  Cardiovascular:     Pulses: Normal pulses.  Pulmonary:      Effort: Pulmonary effort is normal.  Musculoskeletal:        General: No swelling, tenderness or deformity. Normal range of motion.  Skin:    General: Skin is warm and dry.  Neurological:     Mental Status: He is alert and oriented to person, place, and time.     Sensory: No sensory deficit.     Motor: No weakness.     Deep Tendon Reflexes: Reflexes normal.  Psychiatric:        Behavior: Behavior normal.     ED Results / Procedures / Treatments   Labs (all labs ordered are listed, but only abnormal results are displayed) Labs Reviewed  URINALYSIS, ROUTINE W REFLEX MICROSCOPIC    EKG None  Radiology DG Abdomen 1 View  Result Date: 07/17/2019 CLINICAL DATA:  Left-sided flank pain for 2 days EXAM: ABDOMEN - 1 VIEW COMPARISON:  None. FINDINGS: The bowel gas pattern is normal. No radio-opaque calculi or other significant radiographic abnormality are seen. IMPRESSION: No acute abnormality noted. Electronically Signed   By: Inez Catalina M.D.   On: 07/17/2019 21:27    Procedures Procedures (including critical care time)  Medications Ordered in ED Medications - No data to display  ED Course  I have reviewed the triage vital signs and the nursing notes.  Pertinent labs & imaging results that were available during my care of the patient were reviewed by me and considered in my medical decision making (see chart for details).  Clinical Course as of Jul 16 2136  Tue Jul 17, 7571  566 39 year old male with complaint of left back pain, no history of kidney stones however his doctor told him that drinking a lot of green tea could cause kidney stones and he is concerned this is as result of a kidney stone.  Pain is worse with movement, no pain with palpation.  Patient appears comfortable, he has not had any vomiting or changes in bowel or bladder symptoms.  X-ray is negative for stones, urinalysis is normal.  Suspect musculoskeletal pain as source of patient's pain, will not follow with CT  scan today however if patient develops vomiting or worsening pain could consider on return to ER visit otherwise recommend patient follow-up with PCP if his pain persists.  Patient can take Motrin Tylenol in the meantime for pain as needed.   [LM]    Clinical Course User Index [LM] Roque Lias   MDM Rules/Calculators/A&P                      Final Clinical Impression(s) / ED Diagnoses Final diagnoses:  Acute  left-sided low back pain without sciatica    Rx / DC Orders ED Discharge Orders    None       Alden Hipp 07/17/19 2138    Maia Plan, MD 07/19/19 1929

## 2021-09-11 IMAGING — DX DG ABDOMEN 1V
2 series · 2 of 2 positions shown · non-contrast
Comparison: None.

CLINICAL DATA: Left-sided flank pain for 2 days

EXAM:
ABDOMEN - 1 VIEW

[abdomen kub (1 of 2)]
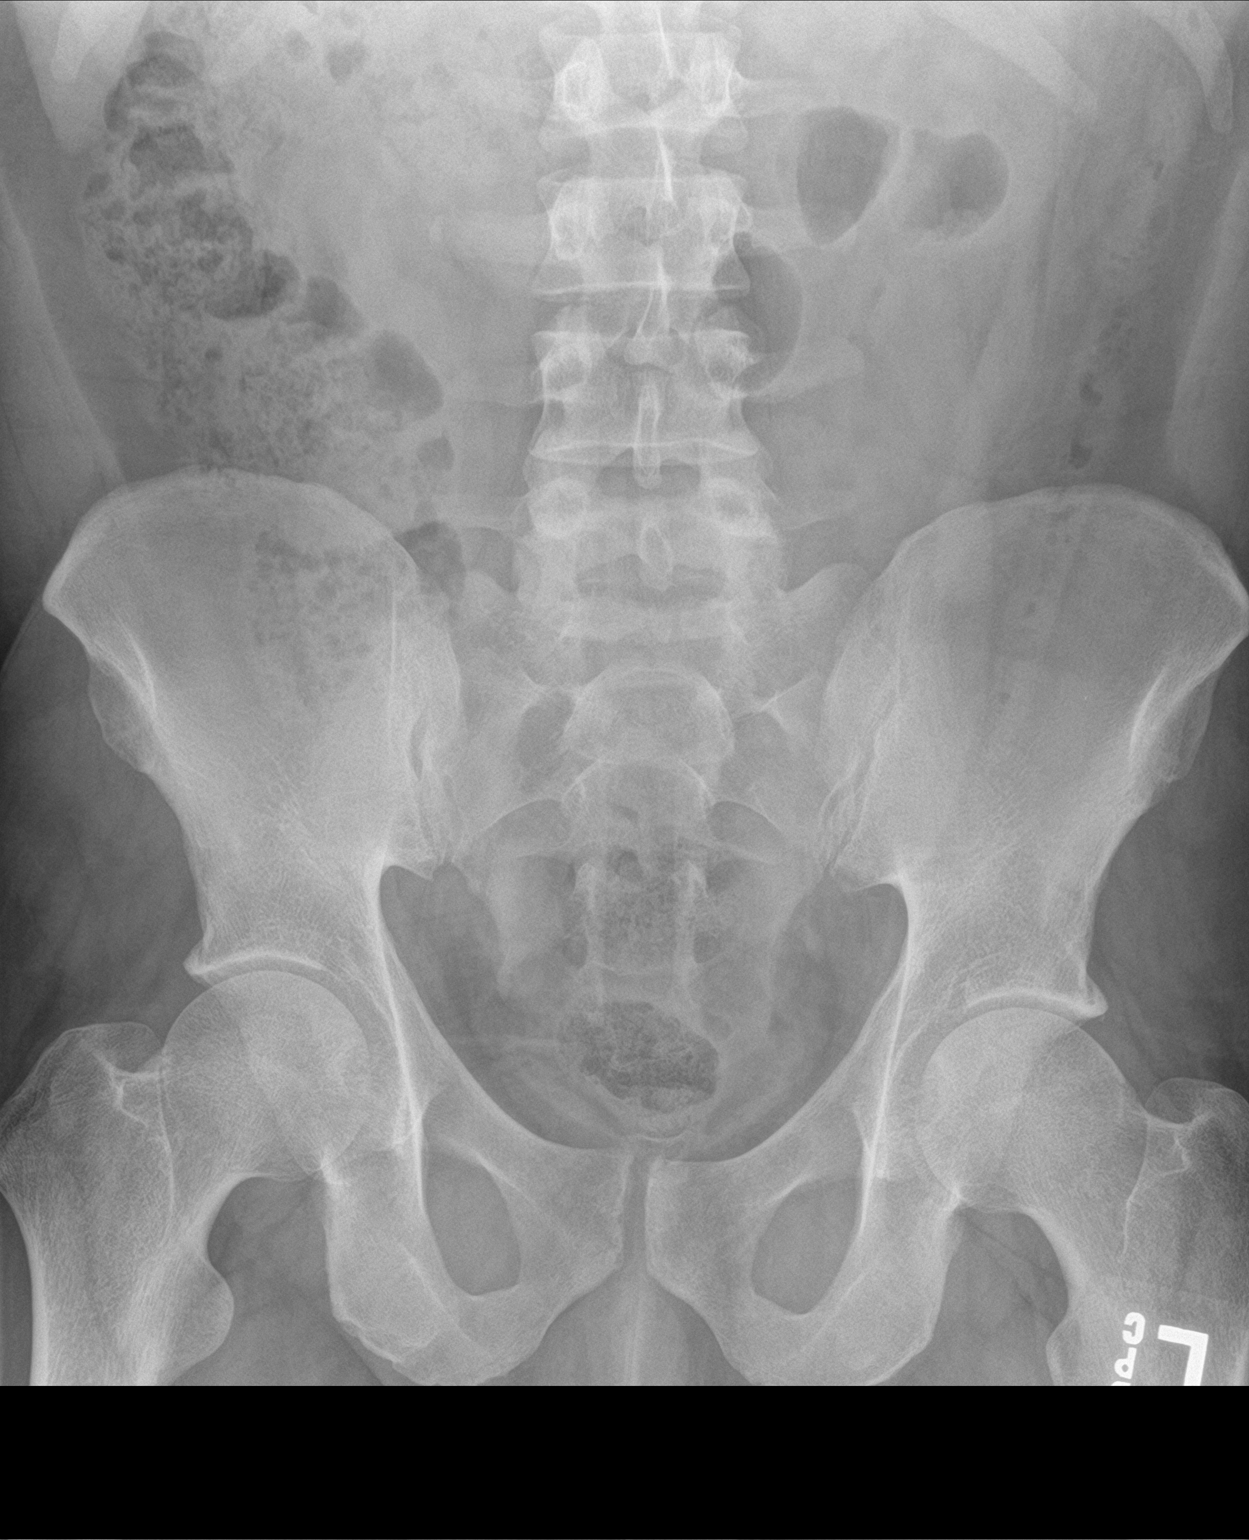

[abdomen kub (2 of 2)]
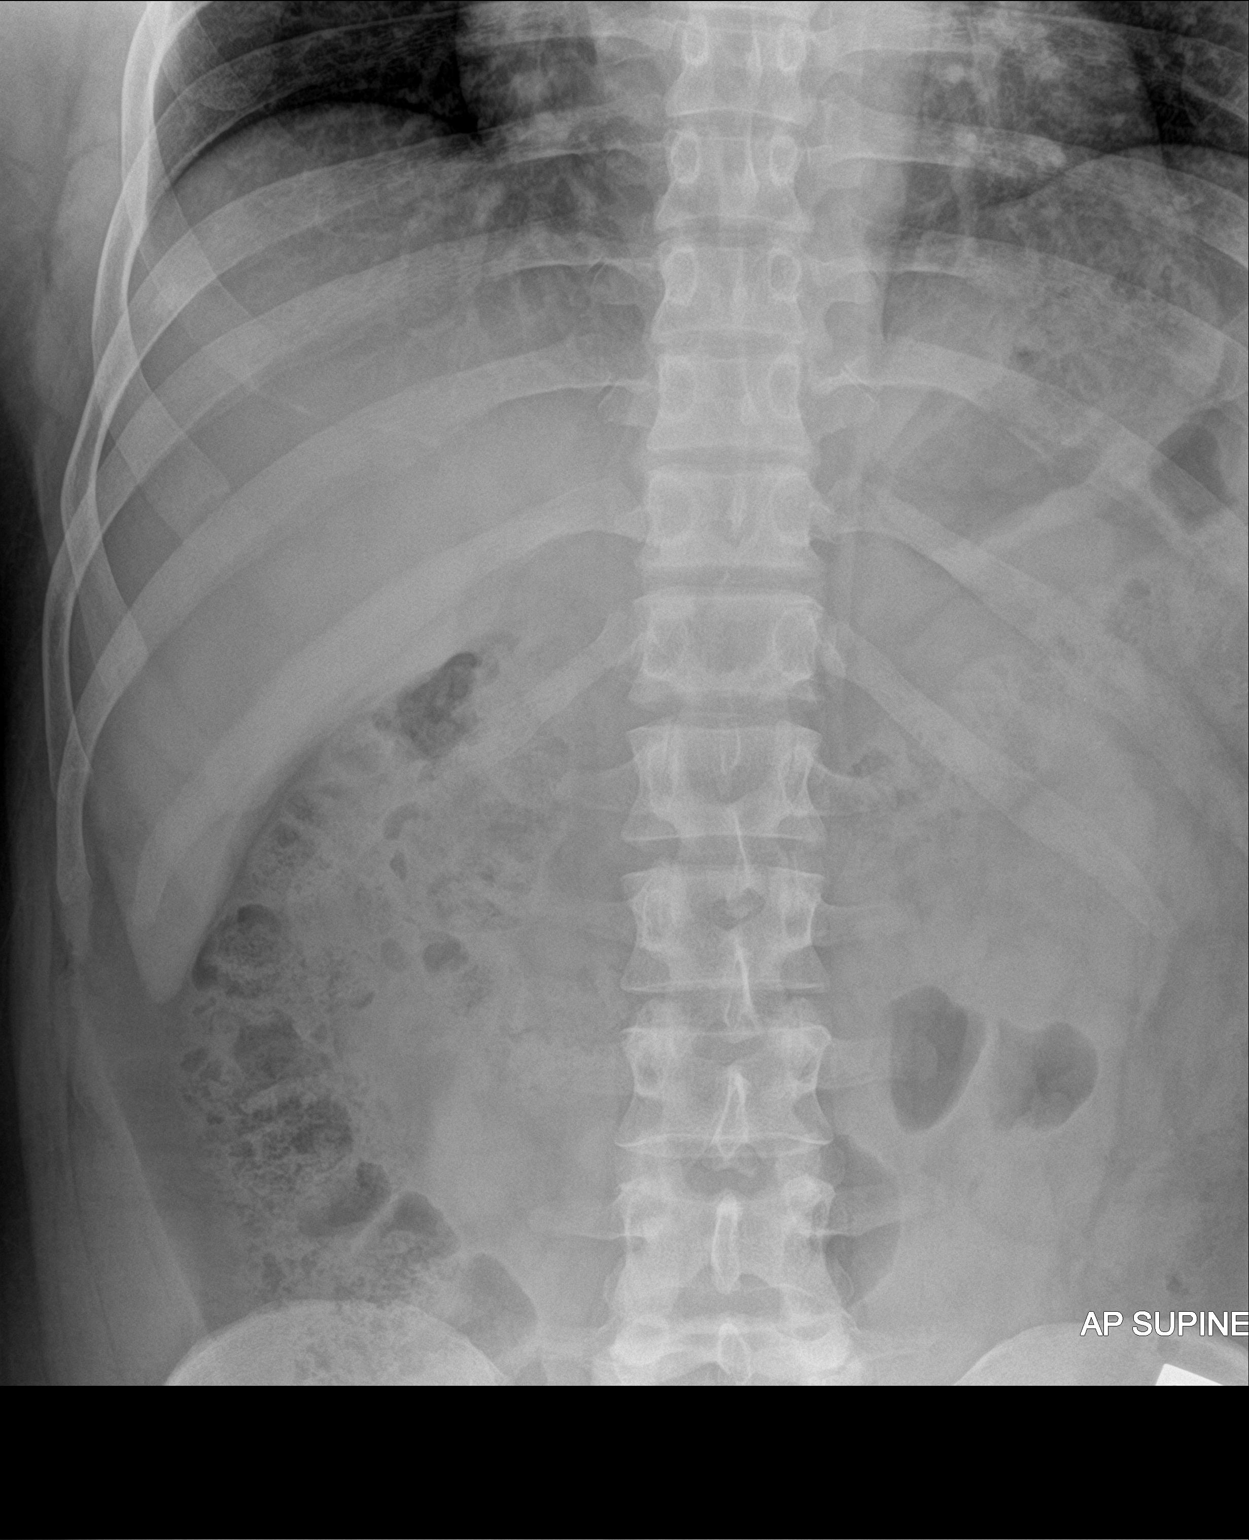

[2 of 2 positions shown; findings below may reference images not displayed]

FINDINGS: The bowel gas pattern is normal. No radio-opaque calculi or other
significant radiographic abnormality are seen.
IMPRESSION: No acute abnormality noted.

## 2023-10-20 DIAGNOSIS — Z419 Encounter for procedure for purposes other than remedying health state, unspecified: Secondary | ICD-10-CM | POA: Diagnosis not present

## 2023-10-20 LAB — GLUCOSE, POCT (MANUAL RESULT ENTRY): POC Glucose: 93 mg/dL (ref 70–99)

## 2023-10-20 NOTE — Congregational Nurse Program (Signed)
  Dept: [phone]   Congregational Nurse Program Note  Date of Encounter: 10/20/2023  Past Medical History: Past Medical History:  Diagnosis Date   Anxiety    Depression    Hyperlipidemia    Hypertension     Encounter Details:  Community Questionnaire - 10/20/23 1421       Questionnaire   Ask client: Do you give verbal consent for me to treat you today? Yes    Student Assistance N/A    Location Patient Served  [facility]    Encounter Setting CN site    Population Status Unknown    Insurance Uninsured ([employer]/Care Connects/Self-Pay/Medicaid Family Planning)    Insurance/Financial Assistance Referral [employer], trade [employer]    Medication Patient Medications Reviewed    Medical Provider No    Screening Referrals Made Annual Wellness Visit    Medical Referrals Made Non-[facility] PCP/Clinic    Medical Appointment Completed N/A    CNP Interventions Advocate/Support;Navigate Healthcare System;Case Management;Counsel;Educate    Screenings CN Performed Blood Pressure;Blood Glucose;Weight    ED Visit Averted Yes    Life-Saving Intervention Made Yes

## 2023-10-20 NOTE — Congregational Nurse Program (Signed)
 Pt was followed up with a  health interview/assessment upon completing their enrollment into the [employer] Connect Uninsured Program on today (6.12.25).     Pt states currently does not have a PCP to date, but last saw Izetta Marshall, MD in 2020, but unable to continue to see due to financial stress . Pt states he has been out of incarceration for approximately 3-4 months and this is when he has last took his BP and Bipolar meds  Chief Reasons Needed for PCP - Wellness check to re- establish care with new PCP -Requests 2  medications to be refilled (BP and Bi-polar meds)   Past /Current Medical History (self reported) Anxiety/ Depression Bipolar  High cholesterol High blood pressure  Medication Hx  -Denies taking any medications currently.  Last took all meds 3 months ago,  Stated he chose to Not take cholesterol meds due to feeling of acid reflux  Socio-determinants health needs identified: Food   PHQ-9= 14 (Moderate) pt admits to feeling down due to not being able be as successful financially at this time.  Denies  having any thoughts of suicide or homicide ideations    State  housing and transportation is stable at this time, but do feel as though he can benefit from being connected with behavioral health services for therapy and his medications if the PCP is unable to assist.  States he is aware of [facility] services and would be willing to get connected, if need be  Completed During today's Visit -Conducted health screenings for diabetes and hypertension along with basic vital signs during visit  (BP readings  =ABN) see vital section Pt states he would not like to go to ER for his BP on today, but was advised to go to ER dept if BP go over 180/120 along with experiencing any additional life threatening symptoms Pt states he will monitor his BP and make a decision to go hospital based on his numbers.  Also instructed on where to obtain a BP monitor if can't get elsewhere (local pharmacy_  _Pt  offered to shop onsite food market at [employer] and agreed to tour and shop [employer]  Goals Achieved During Visit -Pt completed enrollment into the Care Connect Uninsured Program on today and pt was approved with eligibility thru 6.12.25 by [facility].(Care Guide)  -Reviewed Get Care Now steps and facilities to utilize when non-emergent vs emergent conditions arise   -Scheduled Appointment for  follow up visit with PCP at [facility] for (Monday) 6.16.25 at 10am  Plans  -Continous nurse case management upon completion of first medical appointment and thereafter will be maintained with patient while pt is enrolled into Care Connect program -Referred to [facility] as it relates to mental health and medication for bi-polar condition -Provided a [employer] as it relates to mental health services and job/educational resources

## 2023-10-24 ENCOUNTER — Other Ambulatory Visit: Payer: Self-pay | Admitting: Physician Assistant

## 2023-10-24 ENCOUNTER — Encounter: Payer: Self-pay | Admitting: Physician Assistant

## 2023-10-24 ENCOUNTER — Other Ambulatory Visit (HOSPITAL_COMMUNITY)
Admission: RE | Admit: 2023-10-24 | Discharge: 2023-10-24 | Disposition: A | Discharge status: Home / Self Care | Payer: No Typology Code available for payment source | Source: Ambulatory Visit | Attending: Physician Assistant | Admitting: Physician Assistant

## 2023-10-24 ENCOUNTER — Ambulatory Visit: Payer: Self-pay | Admitting: Physician Assistant

## 2023-10-24 ENCOUNTER — Telehealth: Payer: Self-pay

## 2023-10-24 VITALS — BP 150/99 | HR 70 | Temp 97.8°F | Wt 220.0 lb

## 2023-10-24 DIAGNOSIS — Z7689 Persons encountering health services in other specified circumstances: Secondary | ICD-10-CM

## 2023-10-24 DIAGNOSIS — E785 Hyperlipidemia, unspecified: Secondary | ICD-10-CM | POA: Diagnosis present

## 2023-10-24 DIAGNOSIS — Z833 Family history of diabetes mellitus: Secondary | ICD-10-CM

## 2023-10-24 DIAGNOSIS — Z131 Encounter for screening for diabetes mellitus: Secondary | ICD-10-CM | POA: Diagnosis present

## 2023-10-24 DIAGNOSIS — D649 Anemia, unspecified: Secondary | ICD-10-CM

## 2023-10-24 DIAGNOSIS — I10 Essential (primary) hypertension: Secondary | ICD-10-CM | POA: Diagnosis present

## 2023-10-24 DIAGNOSIS — Z1211 Encounter for screening for malignant neoplasm of colon: Secondary | ICD-10-CM

## 2023-10-24 LAB — COMPREHENSIVE METABOLIC PANEL WITH GFR
ALT: 21 U/L (ref 0–44)
AST: 17 U/L (ref 15–41)
Albumin: 4.6 g/dL (ref 3.5–5.0)
Alkaline Phosphatase: 55 U/L (ref 38–126)
Anion gap: 13 (ref 5–15)
BUN: 14 mg/dL (ref 6–20)
CO2: 24 mmol/L (ref 22–32)
Calcium: 9.7 mg/dL (ref 8.9–10.3)
Chloride: 100 mmol/L (ref 98–111)
Creatinine, Ser: 1.26 mg/dL — ABNORMAL HIGH (ref 0.61–1.24)
GFR, Estimated: >60 mL/min (ref >60)
Glucose, Bld: 109 mg/dL — ABNORMAL HIGH (ref 70–99)
Potassium: 3.5 mmol/L (ref 3.5–5.1)
Sodium: 137 mmol/L (ref 135–145)
Total Bilirubin: 1.3 mg/dL — ABNORMAL HIGH (ref 0.0–1.2)
Total Protein: 8.4 g/dL — ABNORMAL HIGH (ref 6.5–8.1)

## 2023-10-24 LAB — CBC
HCT: 40.1 % (ref 39.0–52.0)
Hemoglobin: 13.4 g/dL (ref 13.0–17.0)
MCH: 29.2 pg (ref 26.0–34.0)
MCHC: 33.4 g/dL (ref 30.0–36.0)
MCV: 87.4 fL (ref 80.0–100.0)
Platelets: 288 10*3/uL (ref 150–400)
RBC: 4.59 MIL/uL (ref 4.22–5.81)
RDW: 12.3 % (ref 11.5–15.5)
WBC: 4.8 10*3/uL (ref 4.0–10.5)
nRBC: 0 % (ref 0.0–0.2)

## 2023-10-24 LAB — LIPID PANEL
Cholesterol: 284 mg/dL — ABNORMAL HIGH (ref 0–200)
HDL: 40 mg/dL — ABNORMAL LOW (ref >40)
LDL Cholesterol: 207 mg/dL — ABNORMAL HIGH (ref 0–99)
Total CHOL/HDL Ratio: 7.1 ratio
Triglycerides: 183 mg/dL — ABNORMAL HIGH (ref <150)
VLDL: 37 mg/dL (ref 0–40)

## 2023-10-24 LAB — HEMOGLOBIN A1C
Hgb A1c MFr Bld: 4.7 % — ABNORMAL LOW (ref 4.8–5.6)
Mean Plasma Glucose: 88.19 mg/dL

## 2023-10-24 MED ORDER — LISINOPRIL-HYDROCHLOROTHIAZIDE 10-12.5 MG PO TABS: 1.0000 | ORAL_TABLET | Freq: Every day | ORAL | 1 refills | Status: AC

## 2023-10-24 NOTE — Progress Notes (Signed)
 BP (!) 150/99   Pulse 70   Temp 97.8 F (36.6 C)   Wt 220 lb (99.8 kg)   SpO2 98%   BMI 31.57 kg/m    Subjective:    Patient ID: Bruce Mitchell, male    DOB: July 19, 1980, 43 y.o.   MRN: [id number]  HPI: Bruce Mitchell is a 43 y.o. male presenting on 10/24/2023 for New Patient (Initial Visit)   HPI  Pt is 42yoM who is in today to establish care.  He has not been on his bp meds since getting out of jail ab few month ago.   In the past, he saw Dr Del Favia for primary care.    He says his Head feels cloudy.  He Denies HA.  He has No sob or cp. He says he has Been on bp meds for long time/years.  He is Not working now.  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  [place].  He is also Out of bipolar meds- doesn't know what he was taking.  He says he was on meds for this  starting around age 10  Pt says he got two covid vaccination while incarcerated.  He doesn't have a vaccination card.    Relevant past medical, surgical, family and social history reviewed and updated as indicated. Interim medical history since our last visit reviewed. Allergies and medications reviewed and updated.  CURRENT MEDS: None    Review of Systems  Per HPI unless specifically indicated above     Objective:    BP (!) 150/99   Pulse 70   Temp 97.8 F (36.6 C)   Wt 220 lb (99.8 kg)   SpO2 98%   BMI 31.57 kg/m   Wt Readings from Last 3 Encounters:  10/24/23 220 lb (99.8 kg)  10/20/23 220 lb 9.6 oz (100.1 kg)  07/17/19 205 lb (93 kg)    Physical Exam Vitals reviewed.  Constitutional:      General: He is not in acute distress.    Appearance: He is well-developed. He is not ill-appearing or toxic-appearing.  HENT:     Head: Normocephalic and atraumatic.     Right Ear: Tympanic membrane, ear canal and external ear normal.     Left Ear: Tympanic membrane, ear canal and external ear normal.   Eyes:     Extraocular Movements: Extraocular movements  intact.     Conjunctiva/sclera: Conjunctivae normal.     Pupils: Pupils are equal, round, and reactive to light.   Neck:     Thyroid: No thyromegaly.   Cardiovascular:     Rate and Rhythm: Normal rate and regular rhythm.  Pulmonary:     Effort: Pulmonary effort is normal.     Breath sounds: Normal breath sounds. No wheezing or rales.  Abdominal:     General: Bowel sounds are normal.     Palpations: Abdomen is soft. There is no hepatomegaly, splenomegaly or mass.     Tenderness: There is no abdominal tenderness. There is no guarding or rebound.   Musculoskeletal:     Cervical back: Neck supple.     Right lower leg: No edema.     Left lower leg: No edema.  Lymphadenopathy:     Cervical: No cervical adenopathy.   Skin:    General: Skin is warm and dry.     Findings: No rash.   Neurological:     Mental Status: He is alert and oriented to  person, place, and time.     Motor: No weakness or tremor.     Gait: Gait is intact. Gait normal.   Psychiatric:        Behavior: Behavior normal.           Assessment & Plan:    Encounter Diagnoses  Name Primary?   Encounter to establish care Yes   Primary hypertension    Family history of diabetes mellitus    Screening for diabetes mellitus    Anemia, unspecified type    Hyperlipidemia, unspecified hyperlipidemia type    Screening for colon cancer       -Restart lisinopril -hydrochlorothiazide  -Update labs -pt was given FIT test in light of his history of anemia and his father dying in his early 4s with unknown type of cancer -discussed with pt [facility] cannot do std testing; recommend he go to [facility] for this -pt was given contact information for [facility] for evaluation and treatment of his bipolar disorder -pt to follow up 3-4 weeks for recheck bp.  He is to contact office sooner prn

## 2023-10-24 NOTE — Telephone Encounter (Signed)
 F/U phone call was attempted to patient on today s/p first medical visit to her PCP at [facility] appt on 6.1.25   Pt was unavailable at the time of follow up call and unable to leave a voice mail message, however will send a text message for pt to return call to address any questions or concerns that he may have as it relates to any medical or social determinants of health needs as a result of his appt   Plan   -sent a text message on today to advise pt to contact office if he shall have any questions or concerns

## 2023-10-26 ENCOUNTER — Telehealth: Payer: Self-pay

## 2023-10-26 LAB — POC FIT TEST STOOL: Fecal Occult Blood: NEGATIVE

## 2023-10-26 NOTE — Congregational Nurse Program (Signed)
 [facility] on today to receive a BP Monitor [facility]) and  assistance with setting up the BP monitoring to be used for at home self monitoring purposes   Plan -Verified with patient that he is taking his BP Medications as prescribed by her PCP since her last visit on 6.16.25  -Demonstrated and educated pt on the proper use of her BP monitoring along with proper body mechanics   -Pt demonstrated teach back on proper use of BP equipment and proper body mechanics   -Explained to pt how the BP monitor will automatically log his BP readings and can create a report to be able to share with his PCP   with BP logs to maintain documentation on her BP Readings, so that he can share his readings with his PCP on is next visit   -Provided and review handout on Know Your Numbers for BP to assist pt when making the best decision with her medical care    -checked pt BP with new monitoring equipment while in office (See Vital Signs Section for BP readings

## 2023-10-26 NOTE — Telephone Encounter (Signed)
 2nd attempt F/U phone call was made to patient on today s/p first medical visit to her PCP at the [facility] per his visit on the 6.16.25 with the PCP  Pt stated they have no current needs/ questions at this time of call as it relates to medical care.  States  that all  prescriptions/medicines were provided during visit and that he was able to successfully pick up his BP meds successfully  States he was able to successfully connect with [facility] and did go to there clinic on Tuesday, 6.17.25.   States he has a follow-up appt on October 31, 2023 to speak with the therapist to further assist with his behavioral health meds for his bi-polar medical condition  Pt identified no other socio determinant health  needs/resources needed at this time, but did express interest of obtaining a BP monitor and getting a copy of the reading food labels handout that was previously presented to him on his last visit with me on 6.12.25  Plan -Scheduled an appointment to obtain a BP monitor for self monitoring purposes with me for today 6.18.25 at 1:30pm at [facility]

## 2023-11-02 ENCOUNTER — Ambulatory Visit: Payer: Self-pay | Admitting: Physician Assistant

## 2023-11-14 ENCOUNTER — Ambulatory Visit: Payer: Self-pay | Admitting: Physician Assistant

## 2023-11-19 DIAGNOSIS — Z419 Encounter for procedure for purposes other than remedying health state, unspecified: Secondary | ICD-10-CM | POA: Diagnosis not present

## 2023-12-20 DIAGNOSIS — Z419 Encounter for procedure for purposes other than remedying health state, unspecified: Secondary | ICD-10-CM | POA: Diagnosis not present

## 2024-01-20 DIAGNOSIS — Z419 Encounter for procedure for purposes other than remedying health state, unspecified: Secondary | ICD-10-CM | POA: Diagnosis not present

## 2024-02-19 DIAGNOSIS — Z419 Encounter for procedure for purposes other than remedying health state, unspecified: Secondary | ICD-10-CM | POA: Diagnosis not present

## 2024-04-07 ENCOUNTER — Other Ambulatory Visit: Payer: Self-pay | Admitting: Physician Assistant
